# Patient Record
Sex: Male | Born: 1985 | Race: White | Hispanic: No | Marital: Single | Smoking: Never smoker
Health system: Southern US, Community
[De-identification: ages and names within clinical notes are randomized; demographics above are authoritative.]

## PROBLEM LIST (undated history)

## (undated) DIAGNOSIS — S060X9A Concussion with loss of consciousness of unspecified duration, initial encounter: Secondary | ICD-10-CM

## (undated) DIAGNOSIS — S060XAA Concussion with loss of consciousness status unknown, initial encounter: Secondary | ICD-10-CM

## (undated) DIAGNOSIS — K219 Gastro-esophageal reflux disease without esophagitis: Secondary | ICD-10-CM

## (undated) DIAGNOSIS — T7840XA Allergy, unspecified, initial encounter: Secondary | ICD-10-CM

## (undated) DIAGNOSIS — G4730 Sleep apnea, unspecified: Secondary | ICD-10-CM

## (undated) HISTORY — DX: Sleep apnea, unspecified: G47.30

## (undated) HISTORY — DX: Concussion with loss of consciousness of unspecified duration, initial encounter: S06.0X9A

## (undated) HISTORY — DX: Allergy, unspecified, initial encounter: T78.40XA

## (undated) HISTORY — DX: Concussion with loss of consciousness status unknown, initial encounter: S06.0XAA

## (undated) HISTORY — PX: UVULOPALATOPHARYNGOPLASTY (UPPP)/TONSILLECTOMY/SEPTOPLASTY: SHX6164

## (undated) HISTORY — DX: Gastro-esophageal reflux disease without esophagitis: K21.9

---
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
## 2009-01-31 ENCOUNTER — Encounter: Admission: RE | Admit: 2009-01-31 | Discharge: 2009-01-31 | Payer: Self-pay | Admitting: Occupational Medicine

## 2009-07-08 ENCOUNTER — Emergency Department (HOSPITAL_COMMUNITY): Admission: EM | Admit: 2009-07-08 | Discharge: 2009-07-08 | Payer: Self-pay | Admitting: Emergency Medicine

## 2011-07-20 ENCOUNTER — Ambulatory Visit: Payer: Self-pay | Admitting: Otolaryngology

## 2011-11-15 ENCOUNTER — Ambulatory Visit: Payer: Self-pay | Admitting: Otolaryngology

## 2011-11-16 LAB — PATHOLOGY REPORT

## 2012-03-30 ENCOUNTER — Ambulatory Visit: Payer: Self-pay | Admitting: Otolaryngology

## 2012-06-07 ENCOUNTER — Ambulatory Visit: Payer: Self-pay | Admitting: General Practice

## 2012-07-02 ENCOUNTER — Emergency Department: Payer: Self-pay | Admitting: Emergency Medicine

## 2012-12-15 ENCOUNTER — Emergency Department: Payer: Self-pay | Admitting: Emergency Medicine

## 2013-09-26 ENCOUNTER — Emergency Department: Payer: Self-pay | Admitting: Student

## 2013-09-26 LAB — PROTIME-INR
INR: 0.9
Prothrombin Time: 12.5 secs (ref 11.5–14.7)

## 2013-09-26 LAB — CBC
HCT: 44.2 % (ref 40.0–52.0)
HGB: 14.7 g/dL (ref 13.0–18.0)
MCH: 29.1 pg (ref 26.0–34.0)
MCHC: 33.3 g/dL (ref 32.0–36.0)
MCV: 88 fL (ref 80–100)
Platelet: 198 10*3/uL (ref 150–440)
RBC: 5.04 10*6/uL (ref 4.40–5.90)
RDW: 12.9 % (ref 11.5–14.5)
WBC: 8.2 10*3/uL (ref 3.8–10.6)

## 2013-09-26 LAB — BASIC METABOLIC PANEL
Anion Gap: 6 — ABNORMAL LOW (ref 7–16)
BUN: 14 mg/dL (ref 7–18)
Calcium, Total: 9 mg/dL (ref 8.5–10.1)
Chloride: 108 mmol/L — ABNORMAL HIGH (ref 98–107)
Co2: 26 mmol/L (ref 21–32)
Creatinine: 1.13 mg/dL (ref 0.60–1.30)
EGFR (African American): >60
EGFR (Non-African Amer.): >60
Glucose: 112 mg/dL — ABNORMAL HIGH (ref 65–99)
Osmolality: 281 (ref 275–301)
Potassium: 3.5 mmol/L (ref 3.5–5.1)
Sodium: 140 mmol/L (ref 136–145)

## 2013-09-26 LAB — RAPID HIV SCREEN (HIV 1/2 AB+AG)

## 2013-09-26 LAB — APTT: Activated PTT: 30.9 secs (ref 23.6–35.9)

## 2013-10-01 ENCOUNTER — Ambulatory Visit: Payer: Self-pay | Admitting: General Practice

## 2014-04-30 NOTE — Op Note (Signed)
PATIENT NAME:  Garrett Hickman, Garrett Hickman MR#:  [id number] DATE OF BIRTH:  1985/01/17  DATE OF PROCEDURE:  11/15/2011  PREOPERATIVE DIAGNOSES:  1. Tonsillar hypertrophy.  2. Chronic tonsillitis. 3. Nasal obstruction. 4. Bilateral inferior turbinate hypertrophy.  5. Uvular hypertrophy. 6. Globus. 7. Obstructive sleep apnea.   POSTOPERATIVE DIAGNOSES:  1. Tonsillar hypertrophy.  2. Chronic tonsillitis. 3. Nasal obstruction. 4. Bilateral inferior turbinate hypertrophy.  5. Uvular hypertrophy. 6. Globus. 7. Obstructive sleep apnea.   PROCEDURES PERFORMED:  1. Tonsillectomy, greater than age 30.  2. Bilateral inferior turbinate reduction via resection of tissues. 3. Uvulectomy.   ANESTHESIA: General endotracheal anesthesia.   ESTIMATED BLOOD LOSS: 50 mL.   IV FLUIDS: Please see anesthesia record.   COMPLICATIONS: None.   DRAINS/STENT PLACEMENTS: Stammberger Sinu-Foam.    SPECIMENS: Right and left tonsils and uvula.   INDICATIONS FOR PROCEDURE: Patient is a 29 year old male with history of obstructive sleep apnea, chronic tonsillitis, chronic tonsillar hypertrophy, globus, uvular elongation and nasal obstruction and inferior turbinate hypertrophy resistant to medical management.   OPERATIVE FINDINGS: Bilateral 3+ turbinates successfully reduced, bilateral 3+ tonsils successfully excised, severely elongated uvula truncated at the base of his soft palate.   DESCRIPTION OF PROCEDURE: After the patient was identified in holding, the benefits and risks of the procedure were discussed and consent was reviewed, patient was taken to the Operating Room, placed in supine position. General endotracheal anesthesia was induced. The patient was rotated 90 degrees. Afrin was sprayed into patient's bilateral nasal cavities and the patient was prepped and draped in a sterile fashion. A 0 degree endoscope was brought onto the field and freer elevator was used to infracture his inferior turbinates bilaterally.  This demonstrated large inferior turbinates and a fairly straight septum. A Kelly clamp was attached to the anterior/inferior aspect of the inferior turbinate for approximately one minute and a Loma BostonGreenwald was used to remove the inferior third of the inferior turbinate on the left. Hemostasis was achieved using Bovie suction cautery and then the inferior turbinate was outfractured using freer elevator. Attention was directed to patient's right side. In similar fashion, a Kelly clamp was attached to the anterior/inferior third for approximately one minute and then under endoscopic visualization anterior/inferior third was removed with a Greenwald through-cutting and hemostasis was achieved using Bovie suction cautery. The inferior turbinate remnant was outfractured. At this time Afrin-soaked pledgets were placed against the cut edge of the inferior turbinate and attention was directed to patient's tonsillar tissue and uvulectomy.   At this time the patient was placed in suspension using a McIvor mouth gag and this was suspended from the Mayo stand. A red rubber catheter was placed in patient's right nasal cavity for retraction of uvula and soft palate superiorly. This demonstrated severely elongated uvula and bilateral 3+ tonsils. At this time the superior pole of the patient's right tonsil was grasped with a curved Allis clamp and this was retracted medially and inferiorly and the patient's right tonsil was excised in a subcapsular plane. Hemostasis was achieved using Bovie electrocautery and Bovie suction cautery. Attention was directed to patient's left tonsil. In a similar fashion, this was grasped and noted be 3+ in nature and was excised in subcapsular plane with moderate amount of fibrosis and the patient's left tonsil was excised in a subcapsular plane. Hemostasis was achieved in bilateral tonsillar beds using Bovie suction cautery. At this time attention was directed to patient's uvula. In angled fashion,  the patient's uvula was excised with Bovie electrocautery at the  level of the patient's soft palate mucosa was preserved on the posterior edge. At this time multiple interrupted Vicryl sutures were used to reapproximate the posterior tonsillar pillar as well as uvula with the soft palate. Care was taken to march these sutures out to elongate patient's soft palate. At this time the patient's oral cavity was copiously irrigated with sterile saline and 2.5 mL of 0.5% Sensorcaine were injected into the anterior and posterior tonsillar pillars and the uvular base. Attention was now directed back to the patient's nose where the Afrin-soaked pledgets were removed. Visualization confirmed continued hemostasis. Stammberger Sinu-Foam was placed against the cut edge of the inferior turbinates bilaterally. At this time care of the patient was transferred to anesthesia.   ____________________________ Kyung Rudd, MD ccv:cms D: 11/15/2011 11:56:34 ET T: 11/15/2011 12:13:11 ET JOB#: [id number]  cc: Kyung Rudd, MD, <Dictator>  Kyung Rudd MD ELECTRONICALLY SIGNED 11/16/2011 7:01

## 2014-10-24 ENCOUNTER — Ambulatory Visit: Payer: Self-pay | Admitting: Physician Assistant

## 2014-10-24 ENCOUNTER — Encounter: Payer: Self-pay | Admitting: Physician Assistant

## 2014-10-24 VITALS — BP 110/69 | HR 79 | Temp 98.1°F

## 2014-10-24 DIAGNOSIS — G44309 Post-traumatic headache, unspecified, not intractable: Secondary | ICD-10-CM

## 2014-10-24 DIAGNOSIS — S060X9S Concussion with loss of consciousness of unspecified duration, sequela: Principal | ICD-10-CM
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
## 2014-10-25 NOTE — Progress Notes (Signed)
Our office talked with Velna HatchetSheila the nurse at [employer], we were told to fax the notes and recommendation for referal to neurology for her to set up through workers comp.  Advised CMA to send records after pt gives consent.  Also, to let pt know that they are setting up the referral.
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
## 2015-07-14 ENCOUNTER — Other Ambulatory Visit
Admission: RE | Admit: 2015-07-14 | Discharge: 2015-07-14 | Disposition: A | Discharge status: Home / Self Care | Payer: Worker's Compensation | Source: Ambulatory Visit | Attending: Family Medicine | Admitting: Family Medicine

## 2016-05-05 ENCOUNTER — Ambulatory Visit: Payer: Self-pay | Admitting: Physician Assistant

## 2016-05-05 ENCOUNTER — Encounter: Payer: Self-pay | Admitting: Physician Assistant

## 2016-05-05 VITALS — BP 134/71 | HR 73 | Temp 98.1°F | Ht 72.0 in | Wt 211.0 lb

## 2016-05-05 DIAGNOSIS — Z0000 Encounter for general adult medical examination without abnormal findings: Secondary | ICD-10-CM

## 2016-05-05 DIAGNOSIS — Z0189 Encounter for other specified special examinations: Secondary | ICD-10-CM

## 2016-05-05 DIAGNOSIS — Z008 Encounter for other general examination: Secondary | ICD-10-CM

## 2016-05-05 MED ORDER — FLUTICASONE PROPIONATE 50 MCG/ACT NA SUSP: 2.0000 | Freq: Every day | NASAL | 12 refills | Status: DC
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
## 2016-05-06 LAB — CMP12+LP+TP+TSH+6AC+CBC/D/PLT
ALBUMIN: 4.8 g/dL (ref 3.5–5.5)
ALT: 32 IU/L (ref 0–44)
AST: 21 IU/L (ref 0–40)
Albumin/Globulin Ratio: 2.1 (ref 1.2–2.2)
Alkaline Phosphatase: 68 IU/L (ref 39–117)
BASOS ABS: 0.1 10*3/uL (ref 0.0–0.2)
BASOS: 1 %
BUN / CREAT RATIO: 13 (ref 9–20)
BUN: 14 mg/dL (ref 6–20)
Bilirubin Total: 1 mg/dL (ref 0.0–1.2)
CHLORIDE: 102 mmol/L (ref 96–106)
Calcium: 9.8 mg/dL (ref 8.7–10.2)
Chol/HDL Ratio: 5.4 ratio — ABNORMAL HIGH (ref 0.0–5.0)
Cholesterol, Total: 204 mg/dL — ABNORMAL HIGH (ref 100–199)
Creatinine, Ser: 1.11 mg/dL (ref 0.76–1.27)
EOS (ABSOLUTE): 0.4 10*3/uL (ref 0.0–0.4)
EOS: 6 %
ESTIMATED CHD RISK: 1.1 times avg. — AB (ref 0.0–1.0)
Free Thyroxine Index: 3 (ref 1.2–4.9)
GFR calc Af Amer: 101 mL/min/{1.73_m2} (ref >59)
GFR calc non Af Amer: 87 mL/min/{1.73_m2} (ref >59)
GGT: 23 IU/L (ref 0–65)
Globulin, Total: 2.3 g/dL (ref 1.5–4.5)
Glucose: 91 mg/dL (ref 65–99)
HDL: 38 mg/dL — ABNORMAL LOW (ref >39)
HEMATOCRIT: 43.3 % (ref 37.5–51.0)
Hemoglobin: 14.5 g/dL (ref 13.0–17.7)
IMMATURE GRANS (ABS): 0 10*3/uL (ref 0.0–0.1)
Immature Granulocytes: 0 %
Iron: 122 ug/dL (ref 38–169)
LDH: 184 IU/L (ref 121–224)
LDL Calculated: 140 mg/dL — ABNORMAL HIGH (ref 0–99)
LYMPHS: 37 %
Lymphocytes Absolute: 2.4 10*3/uL (ref 0.7–3.1)
MCH: 29.2 pg (ref 26.6–33.0)
MCHC: 33.5 g/dL (ref 31.5–35.7)
MCV: 87 fL (ref 79–97)
MONOCYTES: 10 %
Monocytes Absolute: 0.6 10*3/uL (ref 0.1–0.9)
Neutrophils Absolute: 3 10*3/uL (ref 1.4–7.0)
Neutrophils: 46 %
Phosphorus: 3.3 mg/dL (ref 2.5–4.5)
Platelets: 202 10*3/uL (ref 150–379)
Potassium: 4.1 mmol/L (ref 3.5–5.2)
RBC: 4.96 x10E6/uL (ref 4.14–5.80)
RDW: 13.4 % (ref 12.3–15.4)
Sodium: 143 mmol/L (ref 134–144)
T3 Uptake Ratio: 31 % (ref 24–39)
T4, Total: 9.7 ug/dL (ref 4.5–12.0)
TSH: 1.64 u[IU]/mL (ref 0.450–4.500)
Total Protein: 7.1 g/dL (ref 6.0–8.5)
Triglycerides: 130 mg/dL (ref 0–149)
Uric Acid: 7.3 mg/dL (ref 3.7–8.6)
VLDL CHOLESTEROL CAL: 26 mg/dL (ref 5–40)
WBC: 6.4 10*3/uL (ref 3.4–10.8)

## 2016-05-06 LAB — HCV COMMENT:

## 2016-05-06 LAB — HEPATITIS C ANTIBODY (REFLEX)

## 2016-05-06 LAB — VITAMIN D 25 HYDROXY (VIT D DEFICIENCY, FRACTURES): Vit D, 25-Hydroxy: 31.6 ng/mL (ref 30.0–100.0)

## 2016-05-07 NOTE — Addendum Note (Signed)
Addended by: Catha Brow T on: 05/07/2016 02:56 PM   Modules accepted: Orders

## 2016-05-12 LAB — SPECIMEN STATUS REPORT

## 2016-05-12 LAB — ABO/RH: RH TYPE: POSITIVE

## 2016-09-02 NOTE — Addendum Note (Signed)
Addended by: Catha Brow T on: 09/02/2016 09:08 AM   Modules accepted: Orders

## 2021-03-22 ENCOUNTER — Other Ambulatory Visit: Payer: Self-pay

## 2021-03-22 ENCOUNTER — Ambulatory Visit
Admission: RE | Admit: 2021-03-22 | Discharge: 2021-03-22 | Disposition: A | Discharge status: Home / Self Care | Payer: Managed Care, Other (non HMO) | Source: Ambulatory Visit | Attending: Emergency Medicine | Admitting: Emergency Medicine

## 2021-03-22 VITALS — BP 130/94 | HR 67 | Temp 98.2°F | Resp 18 | Ht 72.0 in | Wt 195.0 lb

## 2021-03-22 DIAGNOSIS — H60391 Other infective otitis externa, right ear: Secondary | ICD-10-CM

## 2021-03-22 MED ORDER — CIPROFLOXACIN-DEXAMETHASONE 0.3-0.1 % OT SUSP: 4.0000 [drp] | Freq: Two times a day (BID) | OTIC | 0 refills | Status: AC

## 2021-03-22 NOTE — ED Provider Notes (Signed)
?[facility] ? ? ? ?CSN: [id number] ?Arrival date & time: 03/22/21  1308 ? ? ?  ? ?History   ?Chief Complaint ?Chief Complaint  ?Patient presents with  ? Otalgia  ?  Appt; 2 pm.   ? ? ?HPI ?Garrett Hickman is a 36 y.o. male.  ? ?HPI ? ?36 year old male here for evaluation of right ear pain. ? ?Patient reports that he has been having right ear pain for the last 2 weeks.  He denies any drainage from the ear.  He states that he does have a pain when he puts pressure on his ear and the pain gets worse at night.  He reports feeling like his ear canal is swollen and that he has had some slightly muffled hearing.  He denies any fever, runny nose, nasal congestion, or other cold symptoms. ? ?Past Medical History:  ?Diagnosis Date  ? Allergy   ? Concussion   ? ? ?There are no problems to display for this patient. ? ? ?Past Surgical History:  ?Procedure Laterality Date  ? UVULOPALATOPHARYNGOPLASTY (UPPP)/TONSILLECTOMY/SEPTOPLASTY    ? ? ? ? ? ?Home Medications   ? ?Prior to Admission medications   ?Medication Sig Start Date End Date Taking? Authorizing Provider  ?ciprofloxacin-dexamethasone (CIPRODEX) OTIC suspension Place 4 drops into the right ear 2 (two) times daily for 7 days. 03/22/21 03/29/21 Yes Becky Augusta, NP  ?fluticasone (FLONASE) 50 MCG/ACT nasal spray Place 2 sprays into both nostrils daily. 05/05/16   Faythe Ghee, PA-C  ? ? ?Family History ?Family History  ?Problem Relation Age of Onset  ? Diabetes Neg Hx   ? Heart disease Neg Hx   ? Hypertension Neg Hx   ? Hyperlipidemia Neg Hx   ? ? ?Social History ?Social History  ? ?Tobacco Use  ? Smoking status: Never  ? Smokeless tobacco: Never  ?Vaping Use  ? Vaping Use: Never used  ?Substance Use Topics  ? Alcohol use: No  ?  Alcohol/week: 0.0 standard drinks  ? Drug use: No  ? ? ? ?Allergies   ?Patient has no known allergies. ? ? ?Review of Systems ?Review of Systems  ?Constitutional:  Negative for fever.  ?HENT:  Positive for ear pain and hearing loss.  Negative for congestion, ear discharge and rhinorrhea.   ? ? ?Physical Exam ?Triage Vital Signs ?ED Triage Vitals  ?Enc Vitals Group  ?   BP 03/22/21 1341 (!) 130/94  ?   Pulse Rate 03/22/21 1341 67  ?   Resp 03/22/21 1340 18  ?   Temp 03/22/21 1341 98.2 ?F (36.8 ?C)  ?   Temp Source 03/22/21 1340 Oral  ?   SpO2 03/22/21 1341 100 %  ?   Weight 03/22/21 1339 195 lb (88.5 kg)  ?   Height 03/22/21 1339 6' (1.829 m)  ?   Head Circumference --   ?   Peak Flow --   ?   Pain Score 03/22/21 1339 3  ?   Pain Loc --   ?   Pain Edu? --   ?   Excl. in GC? --   ? ?No data found. ? ?Updated Vital Signs ?BP (!) 130/94 (BP Location: Left Arm)   Pulse 67   Temp 98.2 ?F (36.8 ?C) (Oral)   Resp 18   Ht 6' (1.829 m)   Wt 195 lb (88.5 kg)   SpO2 100%   BMI 26.45 kg/m?  ? ?Visual Acuity ?Right Eye Distance:   ?Left Eye Distance:   ?  Bilateral Distance:   ? ?Right Eye Near:   ?Left Eye Near:    ?Bilateral Near:    ? ?Physical Exam ?Vitals and nursing note reviewed.  ?Constitutional:   ?   Appearance: Normal appearance. He is not ill-appearing.  ?HENT:  ?   Head: Normocephalic and atraumatic.  ?   Right Ear: Tympanic membrane and external ear normal. There is no impacted cerumen.  ?   Left Ear: Tympanic membrane, ear canal and external ear normal. There is no impacted cerumen.  ?Skin: ?   General: Skin is warm and dry.  ?   Capillary Refill: Capillary refill takes less than 2 seconds.  ?   Findings: No erythema or rash.  ?Neurological:  ?   General: No focal deficit present.  ?   Mental Status: He is alert and oriented to person, place, and time.  ?Psychiatric:     ?   Mood and Affect: Mood normal.     ?   Behavior: Behavior normal.     ?   Thought Content: Thought content normal.     ?   Judgment: Judgment normal.  ? ? ? ?UC Treatments / Results  ?Labs ?(all labs ordered are listed, but only abnormal results are displayed) ?Labs Reviewed - No data to display ? ?EKG ? ? ?Radiology ?No results found. ? ?Procedures ?Procedures  (including critical care time) ? ?Medications Ordered in UC ?Medications - No data to display ? ?Initial Impression / Assessment and Plan / UC Course  ?I have reviewed the triage vital signs and the nursing notes. ? ?Pertinent labs & imaging results that were available during my care of the patient were reviewed by me and considered in my medical decision making (see chart for details). ? ?Patient is a very pleasant, nontoxic-appearing 36 year old male here for evaluation of right ear pain.  On exam patient has some erythema and mild edema to the right external auditory canal.  Both tympanic membranes are pearly gray in appearance with scar tissue evident.  No erythema noted.  The left external auditory canal is clear.  Patient exam is consistent with an otitis externa.  I will place him on Ciprodex, 4 drops twice daily for 7 days, for treatment of the otitis externa.  Return precautions reviewed. ? ? ?Final Clinical Impressions(s) / UC Diagnoses  ? ?Final diagnoses:  ?Infective otitis externa of right ear  ? ? ? ?Discharge Instructions   ? ?  ?Instill 4 drops of ciprodex in your right ear twice daily for 7 days. ? ?Avoid getting water in your ear by using a silicone ear plug while in the shower, ? ?Return for new or worsening symptoms.  ? ? ? ? ?ED Prescriptions   ? ? Medication Sig Dispense Auth. Provider  ? ciprofloxacin-dexamethasone (CIPRODEX) OTIC suspension Place 4 drops into the right ear 2 (two) times daily for 7 days. 7.5 mL Becky Augusta, NP  ? ?  ? ?PDMP not reviewed this encounter. ?  ?Becky Augusta, NP ?03/22/21 1411 ? ?

## 2021-03-22 NOTE — ED Triage Notes (Signed)
Patient is here for "Right Ear pain". No discharge from ear. No cold symptoms. No injury to ear.  ?

## 2021-03-22 NOTE — Discharge Instructions (Signed)
Instill 4 drops of ciprodex in your right ear twice daily for 7 days. ? ?Avoid getting water in your ear by using a silicone ear plug while in the shower, ? ?Return for new or worsening symptoms.  ?

## 2021-12-09 ENCOUNTER — Ambulatory Visit (INDEPENDENT_AMBULATORY_CARE_PROVIDER_SITE_OTHER): Payer: Managed Care, Other (non HMO) | Admitting: Urology

## 2021-12-09 ENCOUNTER — Encounter: Payer: Self-pay | Admitting: Urology

## 2021-12-09 VITALS — BP 148/77 | HR 80 | Ht 72.0 in | Wt 205.0 lb

## 2021-12-09 DIAGNOSIS — R7989 Other specified abnormal findings of blood chemistry: Secondary | ICD-10-CM

## 2021-12-09 DIAGNOSIS — E291 Testicular hypofunction: Secondary | ICD-10-CM | POA: Diagnosis not present

## 2021-12-09 NOTE — Progress Notes (Signed)
12/09/2021 2:39 PM   Garrett Hickman [birth year] [id number]  Referring provider: Aurelio Brash, FNP [address] [place],  [place] [address]  Chief Complaint  Patient presents with   Hypogonadism    HPI: Garrett Hickman is a 36 y.o. male referred for evaluation of hypogonadism.  2-3 year history of significant tiredness and fatigue No decreased libido or erectile dysfunction Has noted mild weight gain Married with 3 children and does not desire additional children Testosterone level checked 10/17/2021 was low at 187 ng/mL  PMH: Past Medical History:  Diagnosis Date   Allergy    Concussion     Surgical History: Past Surgical History:  Procedure Laterality Date   UVULOPALATOPHARYNGOPLASTY (UPPP)/TONSILLECTOMY/SEPTOPLASTY      Home Medications:  Allergies as of 12/09/2021   No Known Allergies      Medication List        Accurate as of December 09, 2021  2:39 PM. If you have any questions, ask your nurse or doctor.          fluticasone 50 MCG/ACT nasal spray Commonly known as: FLONASE Place 2 sprays into both nostrils daily.   Vitamin D (Ergocalciferol) 1.25 MG (50000 UNIT) Caps capsule Commonly known as: DRISDOL Take 50,000 Units by mouth once a week.        Allergies: No Known Allergies  Family History: Family History  Problem Relation Age of Onset   Diabetes Neg Hx    Heart disease Neg Hx    Hypertension Neg Hx    Hyperlipidemia Neg Hx     Social History:  reports that he has never smoked. He has never used smokeless tobacco. He reports that he does not drink alcohol and does not use drugs.   Physical Exam: BP (!) 148/77   Pulse 80   Ht 6' (1.829 m)   Wt 205 lb (93 kg)   BMI 27.80 kg/m   Constitutional:  Alert and oriented, No acute distress. HEENT: [place] AT. Respiratory: Normal respiratory effort, no increased work of breathing. GU: Phallus without lesions.  Testes descended bilateral without masses or tenderness.   Estimated testicular volume 20 cc bilaterally Skin: No rashes, bruises or suspicious lesions. Psychiatric: Normal mood and affect.   Assessment & Plan:    1.  Hypogonadism Symptoms significant tiredness, fatigue x 2-3 years We discussed the diagnosis of testosterone is based on 2 abnormal total testosterone levels drawn in the a.m. admitted with signs and symptoms of low testosterone. We discussed various forms of testosterone placement including topical preparations, intramuscular injections, subcutaneous injections, subcutaneous pellet implantation and oral testosterone.  Pros and cons of each form were discussed.  The risk of transference of topical testosterone.  If LH low to mid range normal we discussed the off label use of Clomid to stimulate native testosterone production I had an extensive discussion regarding testosterone replacement therapy including the following: Treatment may result in improvements in erectile function, low sex drive, anemia, bone mineral density, lean body mass, and depressive symptoms; evidence is inconclusive whether testosterone therapy improves cognitive function, measures of diabetes, energy, fatigue, lipid profiles, and quality of life measures; there is no conclusive evidence linking testosterone therapy to the development of prostate cancer; there is no definitive evidence linking testosterone therapy to a higher incidence of venothrombolic events; at the present time it cannot be stated definitively whether testosterone therapy increases or decreases the risk of cardiovascular events including myocardial infarction and stroke. Potential side effects were discussed including erythrocytosis, gynecomastia.  The need for regular monitoring of testosterone levels and hematocrit was discussed. Schedule repeat a.m. testosterone level along with prolactin and LH He will be notified with results and in the meantime will think over options   Riki Altes,  MD  [facility] [address], [address] [place], [place] [address] [phone]

## 2021-12-10 ENCOUNTER — Other Ambulatory Visit: Payer: Managed Care, Other (non HMO)

## 2021-12-10 ENCOUNTER — Encounter: Payer: Self-pay | Admitting: Urology

## 2021-12-10 DIAGNOSIS — R7989 Other specified abnormal findings of blood chemistry: Secondary | ICD-10-CM

## 2021-12-11 LAB — PROLACTIN: Prolactin: 13 ng/mL (ref 4.0–15.2)

## 2021-12-11 LAB — LUTEINIZING HORMONE: LH: 5.1 m[IU]/mL (ref 1.7–8.6)

## 2021-12-11 LAB — TESTOSTERONE: Testosterone: 362 ng/dL (ref 264–916)

## 2021-12-12 LAB — TESTOSTERONE FREE, PROFILE I
Albumin: 4.6 g/dL (ref 4.1–5.1)
Sex Hormone Binding: 22.5 nmol/L (ref 16.5–55.9)
Testost., Free, Calc: 86.5 pg/mL (ref 42.3–190.0)
Testosterone: 366 ng/dL (ref 264–916)

## 2021-12-12 LAB — SPECIMEN STATUS REPORT

## 2021-12-14 ENCOUNTER — Telehealth: Payer: Self-pay | Admitting: Urology

## 2021-12-14 MED ORDER — CLOMIPHENE CITRATE 50 MG PO TABS: 25.0000 mg | ORAL_TABLET | Freq: Every day | ORAL | 0 refills | Status: DC

## 2021-12-14 NOTE — Addendum Note (Signed)
Addended by: Riki Altes on: 12/14/2021 03:41 PM   Modules accepted: Orders

## 2021-12-14 NOTE — Telephone Encounter (Signed)
Repeat testosterone level was low normal and a free testosterone level was within the normal range.  Insurance would not cover TRT without to low levels.  Would recommended a 8 week trial of Clomid with a repeat testosterone level and symptom check in 6 weeks.  If he is agreeable I will send in the Rx

## 2021-12-14 NOTE — Telephone Encounter (Signed)
Notified patient as instructed, patient would like to take the clomid [facility]

## 2023-10-06 ENCOUNTER — Ambulatory Visit: Admitting: Family Medicine

## 2023-10-06 ENCOUNTER — Encounter: Payer: Self-pay | Admitting: Family Medicine

## 2023-10-06 VITALS — BP 121/81 | HR 72 | Temp 97.4°F | Ht 72.0 in | Wt 210.0 lb

## 2023-10-06 DIAGNOSIS — Z0000 Encounter for general adult medical examination without abnormal findings: Secondary | ICD-10-CM | POA: Insufficient documentation

## 2023-10-06 DIAGNOSIS — Z0001 Encounter for general adult medical examination with abnormal findings: Secondary | ICD-10-CM

## 2023-10-06 DIAGNOSIS — E559 Vitamin D deficiency, unspecified: Secondary | ICD-10-CM

## 2023-10-06 DIAGNOSIS — Z114 Encounter for screening for human immunodeficiency virus [HIV]: Secondary | ICD-10-CM

## 2023-10-06 DIAGNOSIS — E291 Testicular hypofunction: Secondary | ICD-10-CM | POA: Diagnosis not present

## 2023-10-06 DIAGNOSIS — Z23 Encounter for immunization: Secondary | ICD-10-CM

## 2023-10-06 NOTE — Assessment & Plan Note (Signed)
 Vitamin d  level today. Will update dosing in chart as he takes 1 capsule daily, unsure of dose.

## 2023-10-06 NOTE — Assessment & Plan Note (Signed)
 Managed at [facility]. On testosterone  cypionate 200mg  weekly. Symptoms well controlled. BP well controlled. CBC today.

## 2023-10-06 NOTE — Progress Notes (Signed)
 New Patient Office Visit  Subjective    Patient ID: Garrett Hickman, male    DOB: 05-30-1985  Age: 38 y.o. MRN: [id number]  CC:  Chief Complaint  Patient presents with   Establish Care    Physical and biometric screening this year     HPI Garrett Hickman presents to establish care. Oriented to practice routines and expectations. Has not been seeing PCP. PMH includes hypogonadism, allergies, GERD, sleep apnea. Goes to [facility] for testosterone  therapy and receives testosterone  cypionate 200mg  weekly. He does exercise 3-6 times weekly cardio and weights. Eats a regular diet. He does work night shift 4 days weekly, is a Emergency planning/management officer.    Tobacco: non-smoker STI: declines Vaccines: tdap today, will consider HPV    Outpatient Encounter Medications as of 10/06/2023  Medication Sig   testosterone  cypionate (DEPOTESTOSTERONE CYPIONATE) 200 MG/ML injection Inject 1 mL into the muscle once a week.   Vitamin D , Ergocalciferol , (DRISDOL) 1.25 MG (50000 UNIT) CAPS capsule Take 50,000 Units by mouth once a week.   [DISCONTINUED] clomiPHENE  (CLOMID ) 50 MG tablet Take 0.5 tablets (25 mg total) by mouth daily.   [DISCONTINUED] fluticasone  (FLONASE ) 50 MCG/ACT nasal spray Place 2 sprays into both nostrils daily.   No facility-administered encounter medications on file as of 10/06/2023.    Past Medical History:  Diagnosis Date   Allergy    Concussion    GERD (gastroesophageal reflux disease) 2015   Sleep apnea 2014    Past Surgical History:  Procedure Laterality Date   UVULOPALATOPHARYNGOPLASTY (UPPP)/TONSILLECTOMY/SEPTOPLASTY      Family History  Problem Relation Age of Onset   Atrial fibrillation Mother    Diabetes Neg Hx    Heart disease Neg Hx    Hypertension Neg Hx    Hyperlipidemia Neg Hx     Social History   Socioeconomic History   Marital status: Single    Spouse name: Not on file   Number of children: Not on file   Years of education: Not on file   Highest education  level: 12th grade  Occupational History    Employer: [employer]  Tobacco Use   Smoking status: Never    Passive exposure: Never   Smokeless tobacco: Never  Vaping Use   Vaping status: Never Used  Substance and Sexual Activity   Alcohol use: No   Drug use: No   Sexual activity: Yes    Partners: Female    Birth control/protection: None  Other Topics Concern   Not on file  Social History Narrative   Not on file   Social Drivers of Health   Financial Resource Strain: Low Risk  (10/04/2023)   Overall Financial Resource Strain (CARDIA)    Difficulty of Paying Living Expenses: Not very hard  Food Insecurity: No Food Insecurity (10/04/2023)   Hunger Vital Sign    Worried About Running Out of Food in the Last Year: Never true    Ran Out of Food in the Last Year: Never true  Transportation Needs: No Transportation Needs (10/04/2023)   PRAPARE - Administrator, Civil Service (Medical): No    Lack of Transportation (Non-Medical): No  Physical Activity: Sufficiently Active (10/04/2023)   Exercise Vital Sign    Days of Exercise per Week: 4 days    Minutes of Exercise per Session: 40 min  Stress: No Stress Concern Present (10/04/2023)   [employer] of Occupational Health - Occupational Stress Questionnaire    Feeling of Stress:  Only a little  Social Connections: Socially Integrated (10/04/2023)   Social Connection and Isolation Panel    Frequency of Communication with Friends and Family: Twice a week    Frequency of Social Gatherings with Friends and Family: Once a week    Attends Religious Services: More than 4 times per year    Active Member of [employer] or Organizations: Yes    Attends Engineer, structural: More than 4 times per year    Marital Status: Married  Catering manager Violence: Not on file    Review of Systems  Constitutional: Negative.   HENT: Negative.    Eyes: Negative.   Respiratory: Negative.    Cardiovascular: Negative.    Gastrointestinal: Negative.   Genitourinary: Negative.   Musculoskeletal: Negative.   Skin: Negative.   Neurological: Negative.   Endo/Heme/Allergies: Negative.   Psychiatric/Behavioral: Negative.    All other systems reviewed and are negative.       Objective    BP 121/81   Pulse 72   Temp (!) 97.4 F (36.3 C)   Ht 6' (1.829 m)   Wt 210 lb 0.6 oz (95.3 kg)   SpO2 98%   BMI 28.49 kg/m   Physical Exam Vitals and nursing note reviewed.  Constitutional:      Appearance: Normal appearance. He is normal weight.  HENT:     Head: Normocephalic and atraumatic.     Right Ear: Tympanic membrane, ear canal and external ear normal.     Left Ear: Tympanic membrane, ear canal and external ear normal.     Nose: Nose normal.     Mouth/Throat:     Mouth: Mucous membranes are moist.     Pharynx: Oropharynx is clear.  Eyes:     Extraocular Movements: Extraocular movements intact.     Right eye: Normal extraocular motion and no nystagmus.     Left eye: Normal extraocular motion and no nystagmus.     Conjunctiva/sclera: Conjunctivae normal.     Pupils: Pupils are equal, round, and reactive to light.  Cardiovascular:     Rate and Rhythm: Normal rate and regular rhythm.     Pulses: Normal pulses.     Heart sounds: Normal heart sounds.  Pulmonary:     Effort: Pulmonary effort is normal.     Breath sounds: Normal breath sounds.  Abdominal:     General: Bowel sounds are normal.     Palpations: Abdomen is soft.  Genitourinary:    Comments: Deferred using shared decision making Musculoskeletal:        General: Normal range of motion.     Cervical back: Normal range of motion and neck supple.  Skin:    General: Skin is warm and dry.     Capillary Refill: Capillary refill takes less than 2 seconds.  Neurological:     General: No focal deficit present.     Mental Status: He is alert. Mental status is at baseline.  Psychiatric:        Mood and Affect: Mood normal.        Speech:  Speech normal.        Behavior: Behavior normal.        Thought Content: Thought content normal.        Cognition and Memory: Cognition and memory normal.        Judgment: Judgment normal.         Assessment & Plan:   Problem List Items Addressed This Visit  Physical exam, annual - Primary   Today your medical history was reviewed and routine physical exam with labs was performed. Recommend 150 minutes of moderate intensity exercise weekly and consuming a well-balanced diet. Advised to stop smoking if a smoker, avoid smoking if a non-smoker, limit alcohol consumption to 1 drink per day for women and 2 drinks per day for men, and avoid illicit drug use. Counseled in mental health awareness and when to seek medical care. Vaccine maintenance discussed. Appropriate health maintenance items reviewed. Return to office in 1 year for annual physical exam. Tdap today.       Relevant Orders   CBC with Differential/Platelet   Comprehensive metabolic panel with GFR   Lipid panel   VITAMIN D  25 Hydroxy (Vit-D Deficiency, Fractures)   HIV Antibody (routine testing w rflx)   Vitamin D  deficiency   Vitamin d  level today. Will update dosing in chart as he takes 1 capsule daily, unsure of dose.       Relevant Orders   VITAMIN D  25 Hydroxy (Vit-D Deficiency, Fractures)   Hypogonadism in male   Managed at [facility]. On testosterone  cypionate 200mg  weekly. Symptoms well controlled. BP well controlled. CBC today.       Other Visit Diagnoses       Screening for HIV (human immunodeficiency virus)       Relevant Orders   HIV Antibody (routine testing w rflx)     Need for vaccination       Relevant Orders   Tdap vaccine greater than or equal to 7yo IM (Completed)       Return in about 1 year (around 10/05/2024) for annual physical with labs 1 week prior.   Jeoffrey GORMAN Barrio, FNP

## 2023-10-06 NOTE — Assessment & Plan Note (Signed)
 Today your medical history was reviewed and routine physical exam with labs was performed. Recommend 150 minutes of moderate intensity exercise weekly and consuming a well-balanced diet. Advised to stop smoking if a smoker, avoid smoking if a non-smoker, limit alcohol consumption to 1 drink per day for women and 2 drinks per day for men, and avoid illicit drug use. Counseled in mental health awareness and when to seek medical care. Vaccine maintenance discussed. Appropriate health maintenance items reviewed. Return to office in 1 year for annual physical exam. Tdap today.

## 2023-10-06 NOTE — Patient Instructions (Signed)
 It was great to meet you today and I'm excited to have you join the [facility] practice. I hope you had a positive experience today! If you feel so inclined, please feel free to recommend our practice to friends and family. Jeoffrey Barrio, FNP-C

## 2023-10-07 LAB — LIPID PANEL
Cholesterol: 193 mg/dL (ref <200)
HDL: 36 mg/dL — ABNORMAL LOW (ref >=40)
LDL Cholesterol (Calc): 132 mg/dL — ABNORMAL HIGH
Non-HDL Cholesterol (Calc): 157 mg/dL — ABNORMAL HIGH (ref <130)
Total CHOL/HDL Ratio: 5.4 (calc) — ABNORMAL HIGH (ref <5.0)
Triglycerides: 131 mg/dL (ref <150)

## 2023-10-07 LAB — COMPREHENSIVE METABOLIC PANEL WITH GFR
AG Ratio: 1.9 (calc) (ref 1.0–2.5)
ALT: 28 U/L (ref 9–46)
AST: 19 U/L (ref 10–40)
Albumin: 4.5 g/dL (ref 3.6–5.1)
Alkaline phosphatase (APISO): 60 U/L (ref 36–130)
BUN/Creatinine Ratio: 11 (calc) (ref 6–22)
BUN: 14 mg/dL (ref 7–25)
CO2: 27 mmol/L (ref 20–32)
Calcium: 9.3 mg/dL (ref 8.6–10.3)
Chloride: 102 mmol/L (ref 98–110)
Creat: 1.28 mg/dL — ABNORMAL HIGH (ref 0.60–1.26)
Globulin: 2.4 g/dL (ref 1.9–3.7)
Glucose, Bld: 84 mg/dL (ref 65–99)
Potassium: 4 mmol/L (ref 3.5–5.3)
Sodium: 140 mmol/L (ref 135–146)
Total Bilirubin: 1.8 mg/dL — ABNORMAL HIGH (ref 0.2–1.2)
Total Protein: 6.9 g/dL (ref 6.1–8.1)
eGFR: 73 mL/min/1.73m2 (ref >=60)

## 2023-10-07 LAB — CBC WITH DIFFERENTIAL/PLATELET
Absolute Lymphocytes: 2635 {cells}/uL (ref 850–3900)
Absolute Monocytes: 832 {cells}/uL (ref 200–950)
Basophils Absolute: 80 {cells}/uL (ref 0–200)
Basophils Relative: 1.1 %
Eosinophils Absolute: 350 {cells}/uL (ref 15–500)
Eosinophils Relative: 4.8 %
HCT: 49.3 % (ref 38.5–50.0)
Hemoglobin: 16.5 g/dL (ref 13.2–17.1)
MCH: 30.5 pg (ref 27.0–33.0)
MCHC: 33.5 g/dL (ref 32.0–36.0)
MCV: 91.1 fL (ref 80.0–100.0)
MPV: 10.2 fL (ref 7.5–12.5)
Monocytes Relative: 11.4 %
Neutro Abs: 3402 {cells}/uL (ref 1500–7800)
Neutrophils Relative %: 46.6 %
Platelets: 238 Thousand/uL (ref 140–400)
RBC: 5.41 Million/uL (ref 4.20–5.80)
RDW: 12.6 % (ref 11.0–15.0)
Total Lymphocyte: 36.1 %
WBC: 7.3 Thousand/uL (ref 3.8–10.8)

## 2023-10-07 LAB — VITAMIN D 25 HYDROXY (VIT D DEFICIENCY, FRACTURES): Vit D, 25-Hydroxy: 39 ng/mL (ref 30–100)

## 2023-10-07 LAB — HIV ANTIBODY (ROUTINE TESTING W REFLEX)
HIV 1&2 Ab, 4th Generation: NONREACTIVE
HIV FINAL INTERPRETATION: NEGATIVE

## 2023-10-12 ENCOUNTER — Ambulatory Visit: Payer: Self-pay | Admitting: Family Medicine

## 2023-10-24 ENCOUNTER — Other Ambulatory Visit

## 2023-10-24 DIAGNOSIS — Z114 Encounter for screening for human immunodeficiency virus [HIV]: Secondary | ICD-10-CM

## 2023-10-25 LAB — COMPLETE METABOLIC PANEL WITHOUT GFR
AG Ratio: 2 (calc) (ref 1.0–2.5)
ALT: 20 U/L (ref 9–46)
AST: 15 U/L (ref 10–40)
Albumin: 4.6 g/dL (ref 3.6–5.1)
Alkaline phosphatase (APISO): 62 U/L (ref 36–130)
BUN: 13 mg/dL (ref 7–25)
CO2: 30 mmol/L (ref 20–32)
Calcium: 9.5 mg/dL (ref 8.6–10.3)
Chloride: 102 mmol/L (ref 98–110)
Creat: 1.21 mg/dL (ref 0.60–1.26)
Globulin: 2.3 g/dL (ref 1.9–3.7)
Glucose, Bld: 85 mg/dL (ref 65–99)
Potassium: 4.4 mmol/L (ref 3.5–5.3)
Sodium: 137 mmol/L (ref 135–146)
Total Bilirubin: 1.2 mg/dL (ref 0.2–1.2)
Total Protein: 6.9 g/dL (ref 6.1–8.1)

## 2024-10-05 ENCOUNTER — Other Ambulatory Visit

## 2024-10-08 ENCOUNTER — Encounter: Admitting: Family Medicine
